# Patient Record
Sex: Male | Born: 1990 | Race: Black or African American | Hispanic: No | Marital: Single | Smoking: Current every day smoker
Health system: Southern US, Community
[De-identification: ages and names within clinical notes are randomized; demographics above are authoritative.]

## PROBLEM LIST (undated history)

## (undated) DIAGNOSIS — W3400XA Accidental discharge from unspecified firearms or gun, initial encounter: Secondary | ICD-10-CM

---
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
## 2020-04-14 ENCOUNTER — Other Ambulatory Visit: Payer: Self-pay

## 2020-04-14 ENCOUNTER — Emergency Department (HOSPITAL_BASED_OUTPATIENT_CLINIC_OR_DEPARTMENT_OTHER): Payer: Medicaid Other

## 2020-04-14 ENCOUNTER — Emergency Department (HOSPITAL_BASED_OUTPATIENT_CLINIC_OR_DEPARTMENT_OTHER)
Admission: EM | Admit: 2020-04-14 | Discharge: 2020-04-14 | Disposition: A | Discharge status: Home / Self Care | Payer: Medicaid Other | Attending: Emergency Medicine | Admitting: Emergency Medicine

## 2020-04-14 ENCOUNTER — Encounter (HOSPITAL_BASED_OUTPATIENT_CLINIC_OR_DEPARTMENT_OTHER): Payer: Self-pay | Admitting: *Deleted

## 2020-04-14 DIAGNOSIS — F1210 Cannabis abuse, uncomplicated: Secondary | ICD-10-CM | POA: Insufficient documentation

## 2020-04-14 DIAGNOSIS — M79671 Pain in right foot: Secondary | ICD-10-CM

## 2020-04-14 HISTORY — DX: Accidental discharge from unspecified firearms or gun, initial encounter: W34.00XA

## 2020-04-14 MED ORDER — HYDROCODONE-ACETAMINOPHEN 5-325 MG PO TABS
1.0000 | ORAL_TABLET | Freq: Once | ORAL | Status: AC
  Administered 2020-04-14: 1 via ORAL
  Filled 2020-04-14: qty 1

## 2020-04-14 MED ORDER — NAPROXEN 375 MG PO TABS: 375.0000 mg | ORAL_TABLET | Freq: Two times a day (BID) | ORAL | 0 refills | Status: DC

## 2020-04-14 NOTE — Discharge Instructions (Signed)
Take Naprosyn as directed.  As we discussed, you can freeze a water bottle and roll your foot over it to help with pain.  Follow-up with referred podiatrist if your symptoms do not improve.  Return the emergency department for any redness, swelling of the foot, numbness, fevers or any other worsening or concerning symptoms.
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
## 2020-04-26 ENCOUNTER — Other Ambulatory Visit: Payer: Self-pay

## 2020-04-26 ENCOUNTER — Emergency Department (HOSPITAL_COMMUNITY)
Admission: EM | Admit: 2020-04-26 | Discharge: 2020-04-26 | Discharge status: Absent without leave | Payer: Medicaid Other

## 2020-04-30 ENCOUNTER — Other Ambulatory Visit: Payer: Self-pay

## 2020-04-30 ENCOUNTER — Encounter (HOSPITAL_BASED_OUTPATIENT_CLINIC_OR_DEPARTMENT_OTHER): Payer: Self-pay | Admitting: Emergency Medicine

## 2020-04-30 ENCOUNTER — Emergency Department (HOSPITAL_BASED_OUTPATIENT_CLINIC_OR_DEPARTMENT_OTHER)
Admission: EM | Admit: 2020-04-30 | Discharge: 2020-04-30 | Disposition: A | Discharge status: Home / Self Care | Payer: Medicaid Other | Attending: Emergency Medicine | Admitting: Emergency Medicine

## 2020-04-30 DIAGNOSIS — M79671 Pain in right foot: Secondary | ICD-10-CM | POA: Insufficient documentation

## 2020-04-30 DIAGNOSIS — R030 Elevated blood-pressure reading, without diagnosis of hypertension: Secondary | ICD-10-CM | POA: Insufficient documentation

## 2020-04-30 MED ORDER — NAPROXEN 250 MG PO TABS: 500.0000 mg | ORAL_TABLET | Freq: Once | ORAL | Status: DC

## 2020-04-30 MED ORDER — CYCLOBENZAPRINE HCL 5 MG PO TABS: 5.0000 mg | ORAL_TABLET | Freq: Three times a day (TID) | ORAL | 0 refills | Status: DC | PRN

## 2020-04-30 MED ORDER — ACETAMINOPHEN 500 MG PO TABS
1000.0000 mg | ORAL_TABLET | Freq: Once | ORAL | Status: AC
Start: 2020-04-30 — End: 2020-04-30
  Administered 2020-04-30: 1000 mg via ORAL
  Filled 2020-04-30: qty 2

## 2020-04-30 MED ORDER — KETOROLAC TROMETHAMINE 60 MG/2ML IM SOLN
60.0000 mg | Freq: Once | INTRAMUSCULAR | Status: AC
  Administered 2020-04-30: 60 mg via INTRAMUSCULAR
  Filled 2020-04-30: qty 2
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
## 2020-04-30 NOTE — Discharge Instructions (Addendum)
Use crutches as needed and ibuprofen 600 mg every 6 hrs and tylenol 1000 mg every 4 hrs for pain control.  Take flexeril as needed for muscle spasm make sure you have food in your stomach.    Use ice regularly and follow-up with specialist as you may need further testing and discuss further treatment options.
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
## 2020-05-04 ENCOUNTER — Telehealth (INDEPENDENT_AMBULATORY_CARE_PROVIDER_SITE_OTHER): Payer: Medicaid Other | Admitting: Primary Care

## 2020-05-08 ENCOUNTER — Other Ambulatory Visit: Payer: Self-pay

## 2020-05-08 ENCOUNTER — Ambulatory Visit (INDEPENDENT_AMBULATORY_CARE_PROVIDER_SITE_OTHER): Payer: 59 | Admitting: Family Medicine

## 2020-05-08 ENCOUNTER — Encounter: Payer: Self-pay | Admitting: Family Medicine

## 2020-05-08 VITALS — BP 144/90 | Ht 69.0 in | Wt 195.0 lb

## 2020-05-08 DIAGNOSIS — M79604 Pain in right leg: Secondary | ICD-10-CM | POA: Diagnosis not present

## 2020-05-08 MED ORDER — NORTRIPTYLINE HCL 10 MG PO CAPS: 10.0000 mg | ORAL_CAPSULE | Freq: Every day | ORAL | 1 refills | Status: AC

## 2020-05-08 NOTE — Patient Instructions (Signed)
You have neuropathic pain (nerve pain) of your right lower extremity. We will refer you to neurology for evaluation and likely nerve conduction studies of this leg. Start nortriptyline 10mg  at bedtime - if you do well with this after a few days, increase to taking 2 tablets at bedtime. Call me in a couple weeks to let me know how you're doing because we can go up on this even further if necessary. Tylenol, aleve if needed in addition to this. Crutches to help get around. Follow up with me in 4 weeks at the latest though we will talk after your nerve test.
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
## 2020-05-22 ENCOUNTER — Other Ambulatory Visit: Payer: Self-pay

## 2020-05-22 ENCOUNTER — Ambulatory Visit
Admission: EM | Admit: 2020-05-22 | Discharge: 2020-05-22 | Disposition: A | Discharge status: Home / Self Care | Payer: 59 | Attending: Physician Assistant | Admitting: Physician Assistant

## 2020-05-22 ENCOUNTER — Ambulatory Visit: Payer: Self-pay

## 2020-05-22 DIAGNOSIS — M25561 Pain in right knee: Secondary | ICD-10-CM

## 2020-05-22 MED ORDER — MELOXICAM 7.5 MG PO TABS: 7.5000 mg | ORAL_TABLET | Freq: Every day | ORAL | 0 refills | Status: AC

## 2020-05-22 MED ORDER — METHOCARBAMOL 500 MG PO TABS: 500.0000 mg | ORAL_TABLET | Freq: Two times a day (BID) | ORAL | 0 refills | Status: AC
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
## 2020-06-08 ENCOUNTER — Encounter: Payer: Self-pay | Admitting: *Deleted

## 2020-06-09 ENCOUNTER — Encounter: Payer: Self-pay | Admitting: Diagnostic Neuroimaging

## 2020-06-09 ENCOUNTER — Ambulatory Visit: Payer: Self-pay | Admitting: Diagnostic Neuroimaging

## 2020-06-09 ENCOUNTER — Telehealth: Payer: Self-pay

## 2020-06-09 NOTE — Telephone Encounter (Signed)
Pt no showed new pt appt with Dr. Marjory Lies on 06/09/2020. This is his first no show.

## 2021-06-01 IMAGING — CR DG FOOT COMPLETE 3+V*R*
3 series · 3 of 3 positions shown · non-contrast
Comparison: None.

CLINICAL DATA: Pain for 4 days

EXAM:
RIGHT FOOT COMPLETE - 3+ VIEW

[t foot ap right]
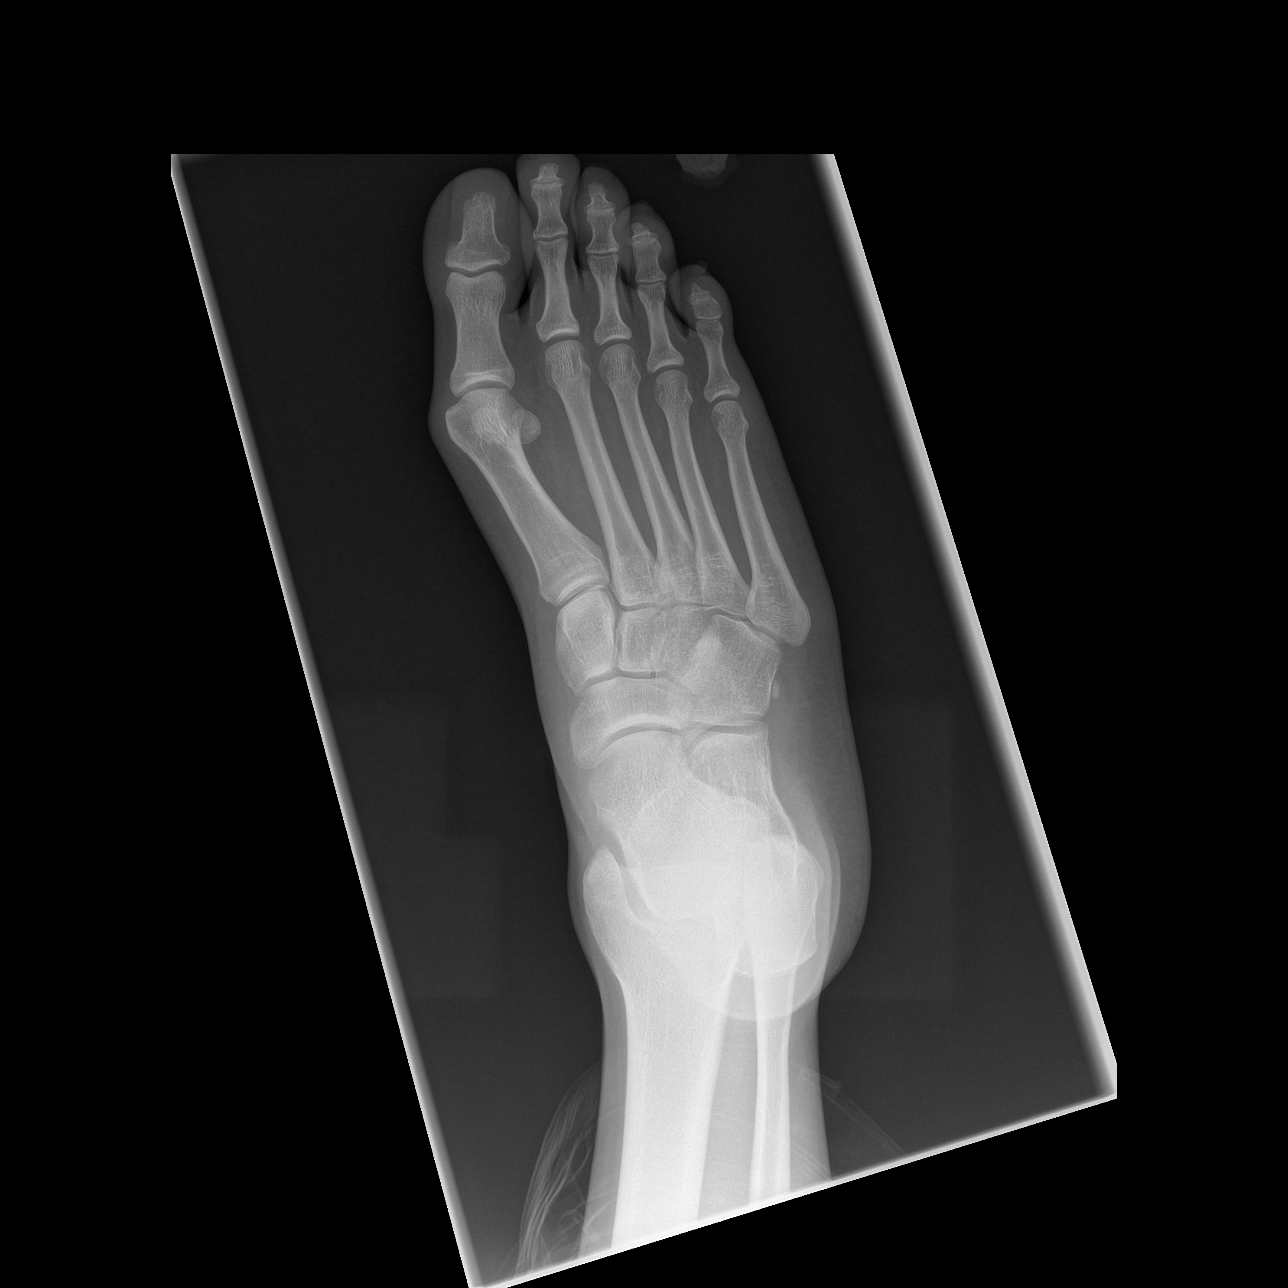

[t foot oblique right]
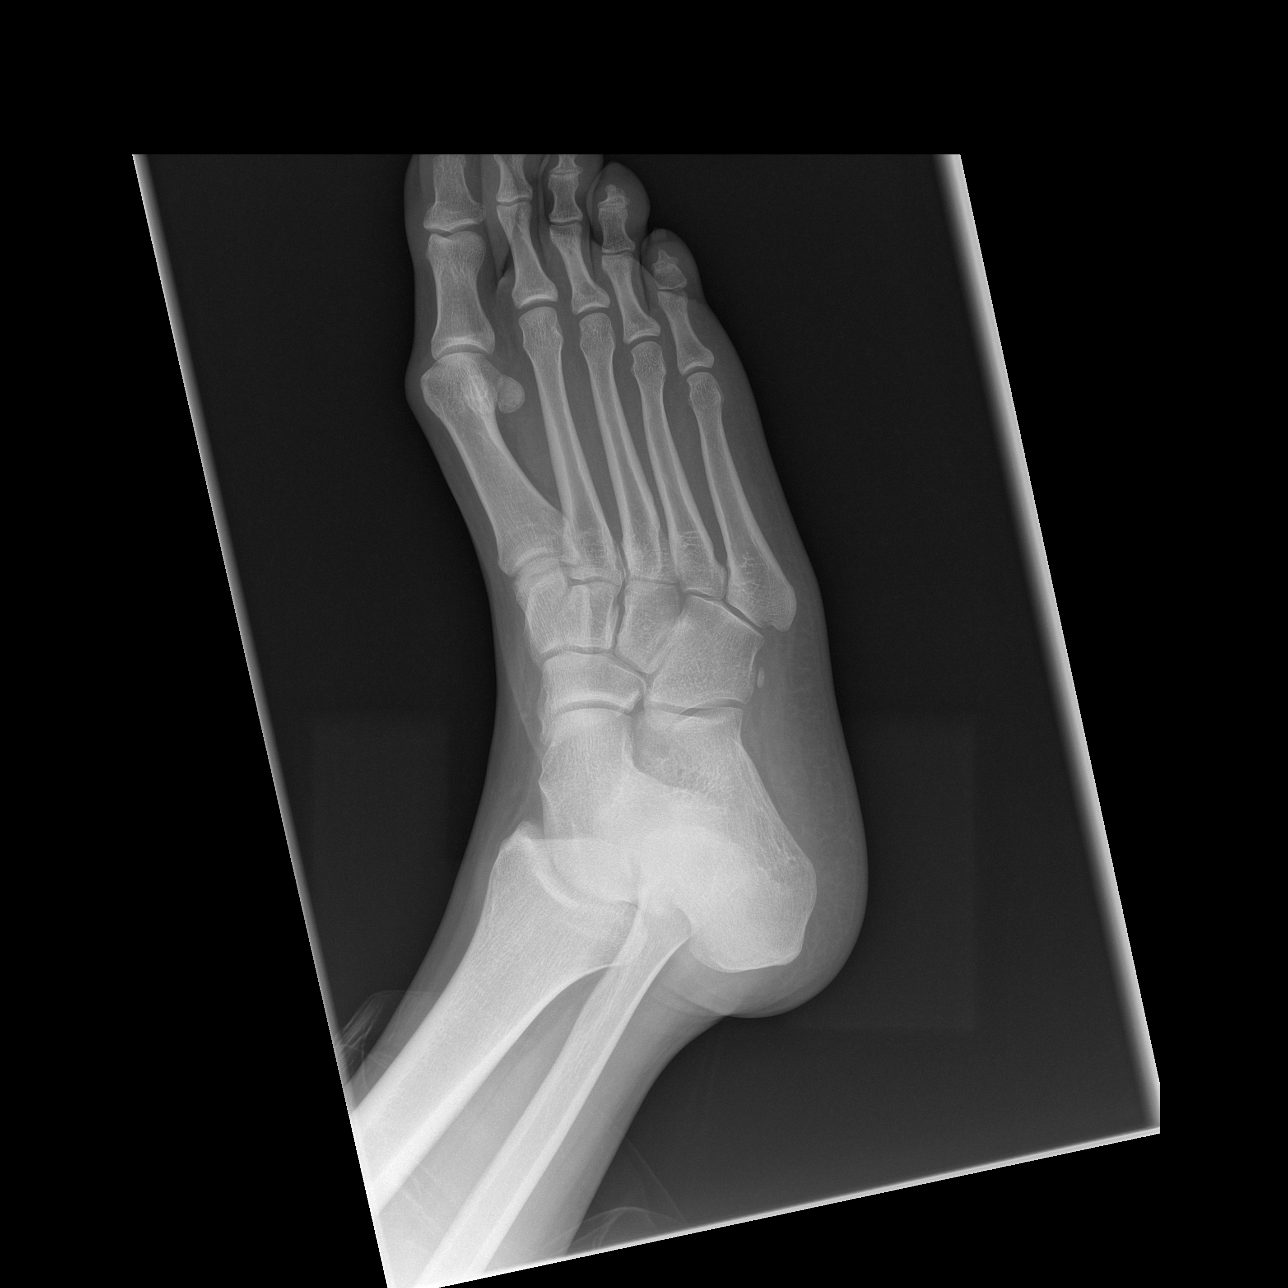

[t foot lat right]
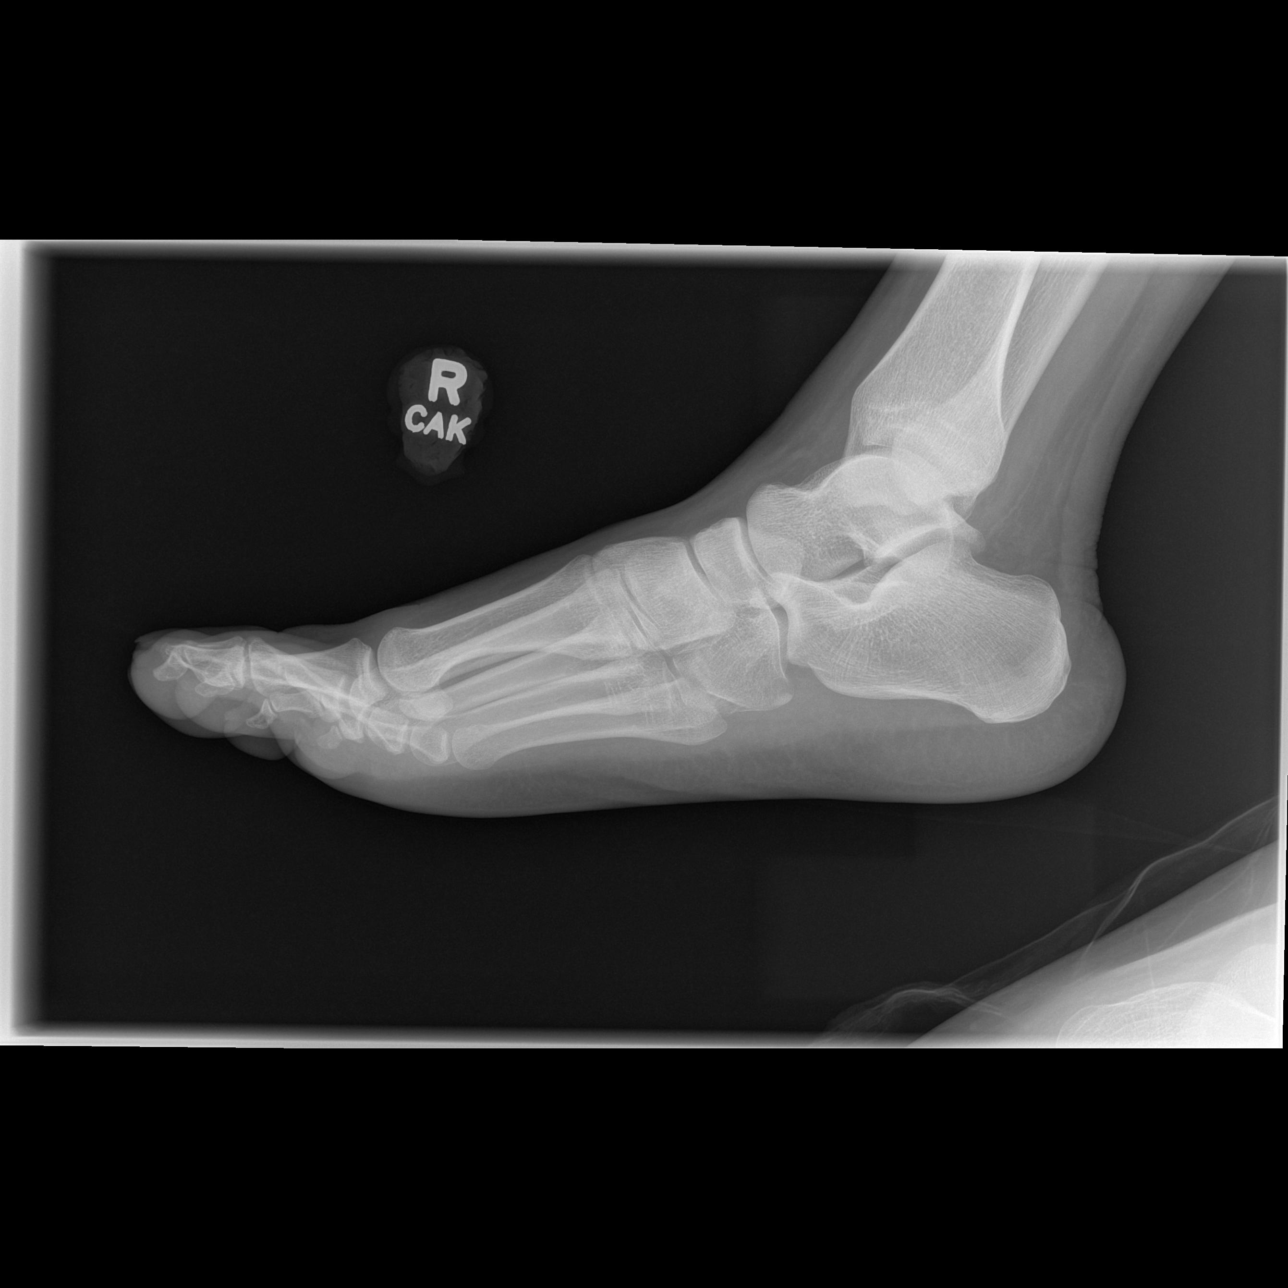

[3 of 3 positions shown; findings below may reference images not displayed]

FINDINGS: Frontal, oblique, and lateral views obtained. There is no fracture
or dislocation. There is hallux valgus deformity at the first MTP
joint. Joint spaces appear normal. No erosive change.
IMPRESSION: Hallux valgus deformity at the first MTP joint. No joint space
narrowing or erosion. No fracture or dislocation.

## 2023-12-19 ENCOUNTER — Other Ambulatory Visit: Payer: Self-pay

## 2023-12-19 ENCOUNTER — Encounter (HOSPITAL_BASED_OUTPATIENT_CLINIC_OR_DEPARTMENT_OTHER): Payer: Self-pay | Admitting: Emergency Medicine

## 2023-12-19 DIAGNOSIS — J101 Influenza due to other identified influenza virus with other respiratory manifestations: Secondary | ICD-10-CM | POA: Insufficient documentation

## 2023-12-19 DIAGNOSIS — Z20822 Contact with and (suspected) exposure to covid-19: Secondary | ICD-10-CM | POA: Diagnosis not present

## 2023-12-19 DIAGNOSIS — Z5321 Procedure and treatment not carried out due to patient leaving prior to being seen by health care provider: Secondary | ICD-10-CM | POA: Insufficient documentation

## 2023-12-19 DIAGNOSIS — R059 Cough, unspecified: Secondary | ICD-10-CM | POA: Diagnosis present

## 2023-12-19 LAB — GROUP A STREP BY PCR: Group A Strep by PCR: NOT DETECTED

## 2023-12-19 LAB — RESP PANEL BY RT-PCR (RSV, FLU A&B, COVID)  RVPGX2
Influenza A by PCR: POSITIVE — AB
Influenza B by PCR: NEGATIVE
Resp Syncytial Virus by PCR: NEGATIVE
SARS Coronavirus 2 by RT PCR: NEGATIVE

## 2023-12-19 NOTE — ED Triage Notes (Signed)
Pt reports cough, sore throat, body aches, fever, & HA that started yesterday; has friend dx with COVID/PNA

## 2023-12-20 ENCOUNTER — Emergency Department (HOSPITAL_BASED_OUTPATIENT_CLINIC_OR_DEPARTMENT_OTHER)
Admission: EM | Admit: 2023-12-20 | Discharge: 2023-12-20 | Discharge status: Against Medical Advice | Payer: 59 | Attending: Emergency Medicine | Admitting: Emergency Medicine
